# Patient Record
Sex: Male | Born: 1980 | Race: White | Hispanic: No | Marital: Married | Smoking: Never smoker
Health system: Southern US, Community
[De-identification: ages and names within clinical notes are randomized; demographics above are authoritative.]

## PROBLEM LIST (undated history)

## (undated) DIAGNOSIS — M549 Dorsalgia, unspecified: Secondary | ICD-10-CM

## (undated) HISTORY — PX: CHOLECYSTECTOMY: SHX55

---

## 2016-02-09 ENCOUNTER — Emergency Department (HOSPITAL_COMMUNITY)
Admission: EM | Admit: 2016-02-09 | Discharge: 2016-02-09 | Disposition: A | Discharge status: Home / Self Care | Payer: 59 | Attending: Emergency Medicine | Admitting: Emergency Medicine

## 2016-02-09 ENCOUNTER — Encounter (HOSPITAL_COMMUNITY): Payer: Self-pay | Admitting: Nurse Practitioner

## 2016-02-09 DIAGNOSIS — X500XXA Overexertion from strenuous movement or load, initial encounter: Secondary | ICD-10-CM | POA: Insufficient documentation

## 2016-02-09 DIAGNOSIS — Y929 Unspecified place or not applicable: Secondary | ICD-10-CM | POA: Diagnosis not present

## 2016-02-09 DIAGNOSIS — M545 Low back pain, unspecified: Secondary | ICD-10-CM

## 2016-02-09 DIAGNOSIS — S39012A Strain of muscle, fascia and tendon of lower back, initial encounter: Secondary | ICD-10-CM | POA: Diagnosis not present

## 2016-02-09 DIAGNOSIS — Y999 Unspecified external cause status: Secondary | ICD-10-CM | POA: Insufficient documentation

## 2016-02-09 DIAGNOSIS — Y9389 Activity, other specified: Secondary | ICD-10-CM | POA: Diagnosis not present

## 2016-02-09 DIAGNOSIS — T148XXA Other injury of unspecified body region, initial encounter: Secondary | ICD-10-CM

## 2016-02-09 HISTORY — DX: Dorsalgia, unspecified: M54.9

## 2016-02-09 MED ORDER — METHOCARBAMOL 500 MG PO TABS: 500.0000 mg | ORAL_TABLET | Freq: Two times a day (BID) | ORAL | 0 refills | Status: DC

## 2016-02-09 MED ORDER — DIAZEPAM 5 MG PO TABS
5.0000 mg | ORAL_TABLET | Freq: Once | ORAL | Status: AC
  Administered 2016-02-09: 5 mg via ORAL
  Filled 2016-02-09: qty 1

## 2016-02-09 NOTE — Discharge Instructions (Signed)
Please read and follow all provided instructions.  Your diagnoses today include:  1. Acute left-sided low back pain without sciatica   2. Muscle strain     Tests performed today include: Vital signs - see below for your results today  Medications prescribed:   Take any prescribed medications only as directed.  Home care instructions:  Follow any educational materials contained in this packet Please rest, use ice or heat on your back for the next several days Do not lift, push, pull anything more than 10 pounds for the next week  Follow-up instructions: Please follow-up with your primary care provider in the next 1 week for further evaluation of your symptoms.   Return instructions:  SEEK IMMEDIATE MEDICAL ATTENTION IF YOU HAVE: New numbness, tingling, weakness, or problem with the use of your arms or legs Severe back pain not relieved with medications Loss control of your bowels or bladder Increasing pain in any areas of the body (such as chest or abdominal pain) Shortness of breath, dizziness, or fainting.  Worsening nausea (feeling sick to your stomach), vomiting, fever, or sweats Any other emergent concerns regarding your health   Additional Information:  Your vital signs today were: BP 115/58 (BP Location: Left Arm)    Pulse 62    Temp 98.1 F (36.7 C) (Oral)    Resp 18    SpO2 99%  If your blood pressure (BP) was elevated above 135/85 this visit, please have this repeated by your doctor within one month. --------------

## 2016-02-09 NOTE — ED Triage Notes (Signed)
Pt c/o non traumatic lower back pain non radiating, denies bowel and bladder incontinence, onset was when he bend down to pick up a toy.

## 2016-02-09 NOTE — ED Provider Notes (Signed)
[facility]-EMERGENCY DEPT Provider Note   CSN: [id number] Arrival date & time: 02/09/16  2026  History   Chief Complaint Chief Complaint  Patient presents with  . Back Pain    HPI Jacob Mcmahon is a 35 y.o. male.  HPI  35 y.o. male presents to the Emergency Department today complaining of left lower back pain s/p lifting injury. Pt states that he bent over around 4 o clock and felt a pop in his low back. Noted pain on right side with pain on ROM. Notes relief with standing still. Describes pain as pulling sensation. Hx same with pulling back muscle. Notes pain 8/10 currently. No loss of bowel or bladder function. No fevers. No saddle anesthesia. No hx IV drug use. No N/V. No dysuria. No other symptoms noted.   Past Medical History:  Diagnosis Date  . Back pain     There are no active problems to display for this patient.   History reviewed. No pertinent surgical history.     Home Medications    Prior to Admission medications   Not on File    Family History History reviewed. No pertinent family history.  Social History Social History  Substance Use Topics  . Smoking status: Never Smoker  . Smokeless tobacco: Never Used  . Alcohol use No     Allergies   Patient has no known allergies.   Review of Systems Review of Systems  Constitutional: Negative for fever.  Gastrointestinal: Negative for nausea and vomiting.  Genitourinary: Negative for dysuria.  Musculoskeletal: Positive for back pain.  Neurological: Negative for numbness.   Physical Exam Updated Vital Signs BP 115/58 (BP Location: Left Arm)   Pulse 62   Temp 98.1 F (36.7 C) (Oral)   Resp 18   SpO2 99%   Physical Exam  Constitutional: He is oriented to person, place, and time. Vital signs are normal. He appears well-developed and well-nourished.  HENT:  Head: Normocephalic and atraumatic.  Right Ear: Hearing normal.  Left Ear: Hearing normal.  Eyes: Conjunctivae and EOM are normal. Pupils  are equal, round, and reactive to light.  Neck: Normal range of motion. Neck supple.  Cardiovascular: Normal rate, regular rhythm, normal heart sounds and intact distal pulses.   Pulmonary/Chest: Effort normal and breath sounds normal.  Musculoskeletal: Normal range of motion.  TTP left lower back. No midline spinous process tenderness. No palpable or visible deformities noted.   Neurological: He is alert and oriented to person, place, and time.  Skin: Skin is warm and dry.  Psychiatric: He has a normal mood and affect. His speech is normal and behavior is normal. Thought content normal.  Nursing note and vitals reviewed.  ED Treatments / Results  Labs (all labs ordered are listed, but only abnormal results are displayed) Labs Reviewed - No data to display  EKG  EKG Interpretation None       Radiology No results found.  Procedures Procedures (including critical care time)  Medications Ordered in ED Medications - No data to display   Initial Impression / Assessment and Plan / ED Course  I have reviewed the triage vital signs and the nursing notes.  Pertinent labs & imaging results that were available during my care of the patient were reviewed by me and considered in my medical decision making (see chart for details).  Clinical Course    Final Clinical Impressions(s) / ED Diagnoses     {I have reviewed the relevant previous healthcare records.  {I obtained HPI from  historian.   ED Course:  Assessment: Patient is a 35yM who presents to the ED with back pain after lifting injury. Hx same. Likely muscle strain. Isolated on left side. No midline tenderness. No neurological deficits appreciated. Patient is ambulatory. No warning symptoms of back pain including: fecal incontinence, urinary retention or overflow incontinence, night sweats, waking from sleep with back pain, unexplained fevers or weight loss, h/o cancer, IVDU, recent trauma. No concern for cauda equina, epidural  abscess, or other serious cause of back pain. Conservative measures such as rest, ice/heat and pain medicine indicated with PCP follow-up if no improvement with conservative management. Given Rx Robaxin.  Disposition/Plan:  DC Home Additional Verbal discharge instructions given and discussed with patient.  Pt Instructed to f/u with PCP in the next week for evaluation and treatment of symptoms. Return precautions given Pt acknowledges and agrees with plan  Supervising Physician Lavera Guiseana Duo Liu, MD  Final diagnoses:  Acute left-sided low back pain without sciatica  Muscle strain    New Prescriptions New Prescriptions   No medications on file     Audry Piliyler Kashawna Manzer, PA-C 02/09/16 2114    Lavera Guiseana Duo Liu, MD 02/10/16 [phone]

## 2016-02-10 ENCOUNTER — Encounter (HOSPITAL_COMMUNITY): Payer: Self-pay

## 2016-02-10 ENCOUNTER — Emergency Department (HOSPITAL_COMMUNITY)
Admission: EM | Admit: 2016-02-10 | Discharge: 2016-02-10 | Disposition: A | Discharge status: Home / Self Care | Payer: 59 | Attending: Emergency Medicine | Admitting: Emergency Medicine

## 2016-02-10 DIAGNOSIS — Y939 Activity, unspecified: Secondary | ICD-10-CM | POA: Insufficient documentation

## 2016-02-10 DIAGNOSIS — S39012A Strain of muscle, fascia and tendon of lower back, initial encounter: Secondary | ICD-10-CM

## 2016-02-10 DIAGNOSIS — M62830 Muscle spasm of back: Secondary | ICD-10-CM | POA: Diagnosis not present

## 2016-02-10 DIAGNOSIS — M5442 Lumbago with sciatica, left side: Secondary | ICD-10-CM | POA: Diagnosis not present

## 2016-02-10 DIAGNOSIS — X501XXA Overexertion from prolonged static or awkward postures, initial encounter: Secondary | ICD-10-CM | POA: Insufficient documentation

## 2016-02-10 DIAGNOSIS — Y929 Unspecified place or not applicable: Secondary | ICD-10-CM | POA: Diagnosis not present

## 2016-02-10 DIAGNOSIS — S3992XA Unspecified injury of lower back, initial encounter: Secondary | ICD-10-CM | POA: Diagnosis present

## 2016-02-10 DIAGNOSIS — Y999 Unspecified external cause status: Secondary | ICD-10-CM | POA: Insufficient documentation

## 2016-02-10 MED ORDER — HYDROCODONE-ACETAMINOPHEN 5-325 MG PO TABS: 1.0000 | ORAL_TABLET | Freq: Four times a day (QID) | ORAL | 0 refills | Status: DC | PRN

## 2016-02-10 MED ORDER — MELOXICAM 15 MG PO TABS: 15.0000 mg | ORAL_TABLET | Freq: Every day | ORAL | 0 refills | Status: DC

## 2016-02-10 MED ORDER — CYCLOBENZAPRINE HCL 10 MG PO TABS: 10.0000 mg | ORAL_TABLET | Freq: Three times a day (TID) | ORAL | 0 refills | Status: DC | PRN

## 2016-02-10 MED ORDER — KETOROLAC TROMETHAMINE 30 MG/ML IJ SOLN
30.0000 mg | Freq: Once | INTRAMUSCULAR | Status: AC
  Administered 2016-02-10: 30 mg via INTRAMUSCULAR
  Filled 2016-02-10: qty 1

## 2016-02-10 MED ORDER — PREDNISONE 20 MG PO TABS: ORAL_TABLET | ORAL | 0 refills | Status: DC

## 2016-02-10 NOTE — ED Triage Notes (Signed)
Pt c/o low back radiating down L leg after bending over to pick something up yesterday.  Pain score 5/10.  Pt reports injuring back several months ago and is followed by a chiropractor.  [facility] and reports not relief w/ prescribed medications.

## 2016-02-10 NOTE — ED Provider Notes (Signed)
[facility]-EMER[facility]ENCY DEPT Provider Note   CSN: [id number] Arrival date & time: 02/10/16  1511     History   Chief Complaint Chief Complaint  Patient presents with  . Back Pain    HPI Jacob Mcmahon is a 35 y.o. male with a PMHx of chronic back pain, who presents to the ED with complaints of acute on chronic lower back pain. Patient states that his had some issues with back pain several times in the past, always related to some sort of straining injury, but yesterday around 5 PM he bent over to pick up a totally and reinjured his back. He states this is the worst of the times that he flared up his back pain. He describes the pain as 6/10 constant spasming/sharp left lower back pain radiating into the left leg, worse with bending and movement, mildly improved with rest, and unrelieved with ibuprofen, ice, and heat. He was seen yesterday in the ER and given Valium and a prescription for Robaxin, which hasn't helped. Previously in December he was seen at an orthopedic urgent care for L hip and back pain, they x-rayed his back and told him there was nothing on the x-ray, and given prednisone and Zanaflex which didn't help much although he states the prednisone did help some with the paresthesias he has intermittently. Associated symptoms include intermittent left posterior thigh and foot tingling. He has an appointment January 8 for orthopedic evaluation and possibly an MRI.  He denies fevers, chills, CP, SOB, abd pain, N/V/D/C, hematuria, dysuria, incontinence of urine/stool, saddle anesthesia or cauda equina symptoms, myalgias, arthralgias, numbness, weakness, or any other complaints. Denies hx of IVDU or CA.    The history is provided by the patient and medical records. No language interpreter was used.  Back Pain   This is a recurrent problem. The current episode started yesterday. The problem occurs constantly. The problem has not changed since onset.Associated with: bending over to pick up  toy. The pain is present in the lumbar spine. Quality: spasmy sharp. The pain radiates to the left thigh. The pain is at a severity of 6/10. The pain is moderate. The symptoms are aggravated by bending and certain positions. The pain is the same all the time. Associated symptoms include tingling (L leg). Pertinent negatives include no chest pain, no fever, no numbness, no abdominal pain, no bowel incontinence, no perianal numbness, no bladder incontinence, no dysuria, no paresis and no weakness. He has tried heat, ice, NSAIDs, bed rest and muscle relaxants for the symptoms. The treatment provided mild (mild improvement with rest; otherwise no improvement with other medications) relief.    Past Medical History:  Diagnosis Date  . Back pain     There are no active problems to display for this patient.   History reviewed. No pertinent surgical history.     Home Medications    Prior to Admission medications   Medication Sig Start Date End Date Taking? Authorizing Provider  methocarbamol (ROBAXIN) 500 MG tablet Take 1 tablet (500 mg total) by mouth 2 (two) times daily. 02/09/16   Audry Piliyler Mohr, PA-C    Family History History reviewed. No pertinent family history.  Social History Social History  Substance Use Topics  . Smoking status: Never Smoker  . Smokeless tobacco: Never Used  . Alcohol use No     Allergies   Patient has no known allergies.   Review of Systems Review of Systems  Constitutional: Negative for chills and fever.  Respiratory: Negative for shortness of  breath.   Cardiovascular: Negative for chest pain.  Gastrointestinal: Negative for abdominal pain, bowel incontinence, constipation, diarrhea, nausea and vomiting.  Genitourinary: Negative for bladder incontinence, difficulty urinating (no incontinence), dysuria and hematuria.  Musculoskeletal: Positive for back pain. Negative for arthralgias and myalgias.  Skin: Negative for color change.  Allergic/Immunologic:  Negative for immunocompromised state.  Neurological: Positive for tingling (L leg). Negative for weakness and numbness.       +intermittent tingling L leg  Psychiatric/Behavioral: Negative for confusion.   10 Systems reviewed and are negative for acute change except as noted in the HPI.   Physical Exam Updated Vital Signs BP 138/79 (BP Location: Left Arm)   Pulse 70   Temp 98.3 F (36.8 C) (Oral)   Resp 18   Ht 6\' 5"  (1.956 m)   Wt 106.6 kg   SpO2 100%   BMI 27.87 kg/m   Physical Exam  Constitutional: He is oriented to person, place, and time. Vital signs are normal. He appears well-developed and well-nourished.  Non-toxic appearance. No distress.  Afebrile, nontoxic, NAD  HENT:  Head: Normocephalic and atraumatic.  Mouth/Throat: Mucous membranes are normal.  Eyes: Conjunctivae and EOM are normal. Right eye exhibits no discharge. Left eye exhibits no discharge.  Neck: Normal range of motion. Neck supple.  Cardiovascular: Normal rate and intact distal pulses.   Pulmonary/Chest: Effort normal. No respiratory distress.  Abdominal: Normal appearance. He exhibits no distension.  Musculoskeletal: Normal range of motion.       Lumbar back: He exhibits tenderness and spasm. He exhibits normal range of motion, no bony tenderness and no deformity.       Back:  Lumbar spine with FROM intact without spinous process TTP, no bony stepoffs or deformities, with moderate L sided paraspinous muscle TTP and palpable muscle spasms. Strength and sensation grossly intact in all extremities, +SLR on L side, neg on R side, gait steady and nonantalgic. No overlying skin changes. Distal pulses intact.   Neurological: He is alert and oriented to person, place, and time. He has normal strength. No sensory deficit.  Skin: Skin is warm, dry and intact. No rash noted.  Psychiatric: He has a normal mood and affect.  Nursing note and vitals reviewed.    ED Treatments / Results  Labs (all labs ordered are  listed, but only abnormal results are displayed) Labs Reviewed - No data to display  EKG  EKG Interpretation None       Radiology No results found.  Procedures Procedures (including critical care time)  Medications Ordered in ED Medications  ketorolac (TORADOL) 30 MG/ML injection 30 mg (not administered)     Initial Impression / Assessment and Plan / ED Course  I have reviewed the triage vital signs and the nursing notes.  Pertinent labs & imaging results that were available during my care of the patient were reviewed by me and considered in my medical decision making (see chart for details).  Clinical Course     35 y.o. male here with acute exacerbation of L lower back pain, states he's aggravated it several times but this is the worst. Has had issues with the L hip as well, saw ortho a few weeks ago and had xrays done which were neg. Started prednisone and zanaflex, pred helped some with the tingling in L leg. Has appt with ortho 02/25/16. Here yesterday, given robaxin, and that didn't help so he's back today. Tenderness to L paraspinous muscle with +spasm. No red flag s/s of low  back pain. No s/s of central cord compression or cauda equina. Lower extremities are neurovascularly intact and patient is ambulating without difficulty. No urinary symptoms. Doubt need for emergent imaging or work up today. Will give toradol here.  Patient was counseled on back pain precautions and told to do activity as tolerated but do not lift, push, or pull heavy objects more than 10 pounds for the next week. Patient counseled to use ice or heat on back for no longer than 15 minutes every hour.   [place] reviewed prior to dispensing controlled substance medications, and was notable for: no controlled substances. Risks/benefits/alternatives and expectations discussed regarding controlled substances. Side effects of medications discussed. Informed consent obtained.   Rx given for flexeril,  instructed to stop robaxin, and counseled on proper use of muscle relaxant medication. Rx given for narcotic pain medicine and counseled on proper use of narcotic pain medications. Told that they can increase to every 4 hrs if needed while pain is worse. Counseled not to combine this medication with others containing tylenol. Urged patient not to drink alcohol, drive, or perform any other activities that requires focus while taking either of these medications. Rx for mobic given, and prednisone.   Patient urged to follow-up with her orthopedist if pain does not improve with treatment and rest or if pain becomes recurrent. Urged to return with worsening severe pain, loss of bowel or bladder control, trouble walking. The patient verbalizes understanding and agrees with the plan.   Final Clinical Impressions(s) / ED Diagnoses   Final diagnoses:  Acute left-sided low back pain with left-sided sciatica  Muscle spasm of back  Strain of lumbar region, initial encounter    New Prescriptions New Prescriptions   CYCLOBENZAPRINE (FLEXERIL) 10 MG TABLET    Take 1 tablet (10 mg total) by mouth 3 (three) times daily as needed for muscle spasms.   HYDROCODONE-ACETAMINOPHEN (NORCO) 5-325 MG TABLET    Take 1 tablet by mouth every 6 (six) hours as needed for severe pain.   MELOXICAM (MOBIC) 15 MG TABLET    Take 1 tablet (15 mg total) by mouth daily. TAKE WITH MEALS; start on 02/11/16   PREDNISONE (DELTASONE) 20 MG TABLET    3 tabs po daily with breakfast x 4 days     [employer], PA-C 02/10/16 1631    Shaune Pollack, MD 02/11/16 1151

## 2016-02-10 NOTE — ED Notes (Signed)
Pt ambulated from waiting room to Minor Care room without difficulty.

## 2016-02-10 NOTE — Discharge Instructions (Signed)
Back Pain: Your back pain should be treated with medicines such as ibuprofen or aleve and this back pain should get better over the next 2 weeks.  However if you develop severe or worsening pain, low back pain with fever, numbness, weakness or inability to walk or urinate, you should return to the ER immediately.  Please follow up with your doctor this week for a recheck if still having symptoms.  Avoid heavy lifting over 10 pounds over the next two weeks. Avoid bending or twisting.  Low back pain is discomfort in the lower back that may be due to injuries to muscles and ligaments around the spine.  Occasionally, it may be caused by a a problem to a part of the spine called a disc.  The pain may last several days or a week;  However, most patients get completely well in 4 weeks.  Self - care:  The application of heat can help soothe the pain.  Maintaining your daily activities, including walking, is encourged, as it will help you get better faster than just staying in bed. Perform gentle stretching as discussed. Drink plenty of fluids.  Medications are also useful to help with pain control.  A commonly prescribed medication includes norco.  Do not drive or operate heavy machinery while taking this medication.  Non steroidal anti inflammatory medications including meloxicam (mobic), Ibuprofen, and naproxen;  These medications help both pain and swelling and are very useful in treating back pain.  They should be taken with food, as they can cause stomach upset, and more seriously, stomach bleeding.    Muscle relaxants:  These medications can help with muscle tightness that is a cause of lower back pain.  Most of these medications can cause drowsiness, and it is not safe to drive or use dangerous machinery while taking them.  Prednisone: this helps with sciatica symptoms, take with breakfast as directed.  SEEK IMMEDIATE MEDICAL ATTENTION IF: New numbness, tingling, weakness, or problem with the use of  your arms or legs.  Severe back pain not relieved with medications.  Difficulty with or loss of control of your bowel or bladder control.  Increasing pain in any areas of the body (such as chest or abdominal pain).  Shortness of breath, dizziness or fainting.  Nausea (feeling sick to your stomach), vomiting, fever, or sweats.  You will need to follow up with  Your orthopedist in 1-2 weeks for reassessment.

## 2016-11-15 LAB — GLUCOSE, POCT (MANUAL RESULT ENTRY): POC GLUCOSE: 113 mg/dL — AB (ref 70–99)

## 2018-02-06 ENCOUNTER — Emergency Department (HOSPITAL_COMMUNITY): Payer: 59

## 2018-02-06 ENCOUNTER — Other Ambulatory Visit: Payer: Self-pay

## 2018-02-06 ENCOUNTER — Emergency Department (HOSPITAL_COMMUNITY)
Admission: EM | Admit: 2018-02-06 | Discharge: 2018-02-06 | Disposition: A | Discharge status: Home / Self Care | Payer: 59 | Attending: Emergency Medicine | Admitting: Emergency Medicine

## 2018-02-06 ENCOUNTER — Encounter (HOSPITAL_COMMUNITY): Payer: Self-pay

## 2018-02-06 DIAGNOSIS — Z79899 Other long term (current) drug therapy: Secondary | ICD-10-CM | POA: Diagnosis not present

## 2018-02-06 DIAGNOSIS — K8020 Calculus of gallbladder without cholecystitis without obstruction: Secondary | ICD-10-CM | POA: Insufficient documentation

## 2018-02-06 DIAGNOSIS — R1011 Right upper quadrant pain: Secondary | ICD-10-CM | POA: Diagnosis present

## 2018-02-06 LAB — COMPREHENSIVE METABOLIC PANEL
ALK PHOS: 59 U/L (ref 38–126)
ALT: 37 U/L (ref 0–44)
ANION GAP: 12 (ref 5–15)
AST: 44 U/L — ABNORMAL HIGH (ref 15–41)
Albumin: 4.4 g/dL (ref 3.5–5.0)
BUN: 21 mg/dL — ABNORMAL HIGH (ref 6–20)
CALCIUM: 9.4 mg/dL (ref 8.9–10.3)
CO2: 22 mmol/L (ref 22–32)
Chloride: 104 mmol/L (ref 98–111)
Creatinine, Ser: 1.05 mg/dL (ref 0.61–1.24)
GFR calc Af Amer: >60 mL/min (ref >60)
GFR calc non Af Amer: >60 mL/min (ref >60)
GLUCOSE: 134 mg/dL — AB (ref 70–99)
Potassium: 3.3 mmol/L — ABNORMAL LOW (ref 3.5–5.1)
SODIUM: 138 mmol/L (ref 135–145)
Total Bilirubin: 0.6 mg/dL (ref 0.3–1.2)
Total Protein: 7.1 g/dL (ref 6.5–8.1)

## 2018-02-06 LAB — CBC
HCT: 43.8 % (ref 39.0–52.0)
HEMOGLOBIN: 14.6 g/dL (ref 13.0–17.0)
MCH: 28.6 pg (ref 26.0–34.0)
MCHC: 33.3 g/dL (ref 30.0–36.0)
MCV: 85.9 fL (ref 80.0–100.0)
Platelets: 275 10*3/uL (ref 150–400)
RBC: 5.1 MIL/uL (ref 4.22–5.81)
RDW: 11.9 % (ref 11.5–15.5)
WBC: 11.4 10*3/uL — ABNORMAL HIGH (ref 4.0–10.5)
nRBC: 0 % (ref 0.0–0.2)

## 2018-02-06 LAB — I-STAT TROPONIN, ED: Troponin i, poc: 0.01 ng/mL (ref 0.00–0.08)

## 2018-02-06 LAB — LIPASE, BLOOD: Lipase: 50 U/L (ref 11–51)

## 2018-02-06 MED ORDER — HYDROMORPHONE HCL 1 MG/ML IJ SOLN
1.0000 mg | Freq: Once | INTRAMUSCULAR | Status: AC
Start: 2018-02-06 — End: 2018-02-06
  Administered 2018-02-06: 1 mg via INTRAVENOUS
  Filled 2018-02-06: qty 1

## 2018-02-06 MED ORDER — HYDROCODONE-ACETAMINOPHEN 5-325 MG PO TABS: 2.0000 | ORAL_TABLET | Freq: Four times a day (QID) | ORAL | 0 refills | Status: DC | PRN

## 2018-02-06 MED ORDER — PANTOPRAZOLE SODIUM 40 MG IV SOLR
40.0000 mg | Freq: Once | INTRAVENOUS | Status: AC
  Administered 2018-02-06: 40 mg via INTRAVENOUS
  Filled 2018-02-06: qty 40

## 2018-02-06 MED ORDER — HYDROMORPHONE HCL 1 MG/ML IJ SOLN
1.0000 mg | Freq: Once | INTRAMUSCULAR | Status: AC
  Administered 2018-02-06: 1 mg via INTRAVENOUS
  Filled 2018-02-06: qty 1

## 2018-02-06 MED ORDER — LIDOCAINE VISCOUS HCL 2 % MT SOLN
15.0000 mL | Freq: Once | OROMUCOSAL | Status: AC
  Administered 2018-02-06: 15 mL via ORAL
  Filled 2018-02-06: qty 15

## 2018-02-06 MED ORDER — ALUM & MAG HYDROXIDE-SIMETH 200-200-20 MG/5ML PO SUSP
30.0000 mL | Freq: Once | ORAL | Status: AC
  Administered 2018-02-06: 30 mL via ORAL
  Filled 2018-02-06: qty 30

## 2018-02-06 MED ORDER — ONDANSETRON 4 MG PO TBDP: 4.0000 mg | ORAL_TABLET | Freq: Three times a day (TID) | ORAL | 0 refills | Status: DC | PRN

## 2018-02-06 MED ORDER — SUCRALFATE 1 G PO TABS: 1.0000 g | ORAL_TABLET | Freq: Three times a day (TID) | ORAL | 0 refills | Status: DC

## 2018-02-06 MED ORDER — SUCRALFATE 1 G PO TABS
1.0000 g | ORAL_TABLET | Freq: Once | ORAL | Status: AC
  Administered 2018-02-06: 1 g via ORAL
  Filled 2018-02-06: qty 1

## 2018-02-06 MED ORDER — MORPHINE SULFATE (PF) 4 MG/ML IV SOLN
4.0000 mg | Freq: Once | INTRAVENOUS | Status: AC
  Administered 2018-02-06: 4 mg via INTRAVENOUS
  Filled 2018-02-06: qty 1

## 2018-02-06 NOTE — ED Notes (Signed)
US at bedside

## 2018-02-06 NOTE — ED Provider Notes (Signed)
Patient [facility] Emergency Department [name] Patient reports lower chest/upper abdominal pain Exam : awake/alert, appears uncomfortable, focal epigastric tenderness, no other focal abd tenderness Plan: will obtain US imaging of upper abdomen Less likely ACS at this time     Jacob Mcmahon, Jacob Barbero, MD 02/06/18 [phone]

## 2018-02-06 NOTE — ED Provider Notes (Signed)
[facility]-EMERGENCY DEPT Provider Note   CSN: [id number] Arrival date & time: 02/06/18  0153     History   Chief Complaint Chief Complaint  Patient presents with  . Chest Pain  . Abdominal Pain    HPI Jacob Mcmahon is a 37 y.o. male.  Patient presents to the emergency department with a chief complaint of epigastric pain.  He states the pain started about 10:30 PM.  He tried taking some Prilosec with no relief.  He states pain is severe.  He is unable to find anything to give him any relief.  Denies any radiating pain to his back.  Denies any pain in his lower abdomen.  States that he has had symptoms similar to this in the past, but they have never lasted this long.  Denies any fever, chills, or productive cough.  Denies any history of heart disease.  States he last drank alcohol on Wednesday night, but does not drink heavily.  The history is provided by the patient. No language interpreter was used.    Past Medical History:  Diagnosis Date  . Back pain     There are no active problems to display for this patient.   History reviewed. No pertinent surgical history.      Home Medications    Prior to Admission medications   Medication Sig Start Date End Date Taking? Authorizing Provider  cyclobenzaprine (FLEXERIL) 10 MG tablet Take 1 tablet (10 mg total) by mouth 3 (three) times daily as needed for muscle spasms. 02/10/16   Street, [place], PA-C  HYDROcodone-acetaminophen (NORCO) 5-325 MG tablet Take 1 tablet by mouth every 6 (six) hours as needed for severe pain. 02/10/16   Street, [place], PA-C  meloxicam (MOBIC) 15 MG tablet Take 1 tablet (15 mg total) by mouth daily. TAKE WITH MEALS; start on 02/11/16 02/10/16   Street, [place], PA-C  methocarbamol (ROBAXIN) 500 MG tablet Take 1 tablet (500 mg total) by mouth 2 (two) times daily. 02/09/16   Audry PiliMohr, [place], PA-C  predniSONE (DELTASONE) 20 MG tablet 3 tabs po daily with breakfast x 4 days  02/10/16   Street, [place], [place]C    Family History History reviewed. No pertinent family history.  Social History Social History   Tobacco Use  . Smoking status: Never Smoker  . Smokeless tobacco: Never Used  Substance Use Topics  . Alcohol use: No  . Drug use: Not on file     Allergies   Patient has no known allergies.   Review of Systems Review of Systems  All other systems reviewed and are negative.    Physical Exam Updated Vital Signs BP 137/82 (BP Location: Left Arm)   Pulse 62   Temp (!) 97.4 F (36.3 C) (Oral)   Resp (!) 30   Ht 6\' 5"  (1.956 m)   Wt 106.6 kg   SpO2 100%   BMI 27.87 kg/m   Physical Exam Vitals signs and nursing note reviewed.  Constitutional:      Appearance: He is well-developed.     Comments: Visibly uncomfortable  HENT:     Head: Normocephalic and atraumatic.  Eyes:     General: No scleral icterus.       Right eye: No discharge.        Left eye: No discharge.     Conjunctiva/sclera: Conjunctivae normal.     Pupils: Pupils are equal, round, and reactive to light.  Neck:     Musculoskeletal: Normal range of motion and neck supple.  Vascular: No JVD.  Cardiovascular:     Rate and Rhythm: Normal rate and regular rhythm.     Heart sounds: Normal heart sounds. No murmur. No friction rub. No gallop.   Pulmonary:     Effort: Pulmonary effort is normal. No respiratory distress.     Breath sounds: Normal breath sounds. No wheezing or rales.  Chest:     Chest wall: No tenderness.  Abdominal:     General: There is no distension.     Palpations: Abdomen is soft. There is no mass.     Tenderness: There is no abdominal tenderness. There is no guarding or rebound.     Comments: No focal tenderness  Musculoskeletal: Normal range of motion.        General: No tenderness.  Skin:    General: Skin is warm and dry.  Neurological:     Mental Status: He is alert and oriented to person, place, and time.  Psychiatric:        Behavior:  Behavior normal.        Thought Content: Thought content normal.        Judgment: Judgment normal.      ED Treatments / Results  Labs (all labs ordered are listed, but only abnormal results are displayed) Labs Reviewed  CBC  LIPASE, BLOOD  COMPREHENSIVE METABOLIC PANEL  I-STAT TROPONIN, ED    EKG EKG Interpretation  Date/Time:  Saturday February 06 2018 02:00:36 EST Ventricular Rate:  60 PR Interval:    QRS Duration: 113 QT Interval:  441 QTC Calculation: 441 R Axis:   49 Text Interpretation:  Sinus rhythm Borderline intraventricular conduction delay Minimal ST depression ST elev, probable normal early repol pattern No previous ECGs available Confirmed by Zadie RhineWickline, Donald ([id number]) on 02/06/2018 2:11:21 AM   Radiology Dg Chest 2 View  Result Date: 02/06/2018 CLINICAL DATA:  Acute onset of central chest pain and epigastric abdominal pain. EXAM: CHEST - 2 VIEW COMPARISON:  None. FINDINGS: The lungs are well-aerated and clear. There is no evidence of focal opacification, pleural effusion or pneumothorax. The heart is normal in size; the mediastinal contour is within normal limits. No acute osseous abnormalities are seen. IMPRESSION: No acute cardiopulmonary process seen. Electronically Signed   By: Roanna RaiderJeffery  Chang M.D.   On: 02/06/2018 02:31   [place] Abdomen Limited  Result Date: 02/06/2018 CLINICAL DATA:  Epigastric and right upper quadrant pain EXAM: ULTRASOUND ABDOMEN LIMITED RIGHT UPPER QUADRANT COMPARISON:  None. FINDINGS: Gallbladder: Nonmobile gallstones in the fundus. Largest gallstone 17 mm. Negative for gallbladder wall thickening. Negative sonographic Murphy sign Common bile duct: Diameter: 4 mm Liver: Mildly increased echogenicity liver diffusely without focal lesion. Portal vein is patent on color Doppler imaging with normal direction of blood flow towards the liver. IMPRESSION: Cholelithiasis without cholecystitis Hepatic steatosis Electronically Signed   By: Marlan Palauharles  Clark  M.D.   On: 02/06/2018 07:24    Procedures Procedures (including critical care time)  Medications Ordered in ED Medications  alum & mag hydroxide-simeth (MAALOX/MYLANTA) 200-200-20 MG/5ML suspension 30 mL (30 mLs Oral Given 02/06/18 0230)    And  lidocaine (XYLOCAINE) 2 % viscous mouth solution 15 mL (15 mLs Oral Given 02/06/18 0230)  morphine 4 MG/ML injection 4 mg (4 mg Intravenous Given 02/06/18 0234)     Initial Impression / Assessment and Plan / ED Course  I have reviewed the triage vital signs and the nursing notes.  Pertinent labs & imaging results that were available during my care of the  patient were reviewed by me and considered in my medical decision making (see chart for details).     Patient with severe epigastric pain.  Symptoms started tonight before bed.  Tried taking Prilosec with no relief.  Vital signs are stable.  Patient appears very uncomfortable.  Will give pain medicine.  Will give GI cocktail and Protonix.  Will reassess.  Laboratory work-up is reassuring.  Lipase normal.  Troponin negative.  EKG shows no ischemic changes.  Doubt ACS.  Pain does not radiate to his back, doubt pancreatitis.  He does not have any lower abdominal pain.  Patient seen by and discussed with Dr. Bebe Shaggy, who recommends adding RUQ [place].  Patient signed out to [employer], [place], who will follow up on ultrasound. Final Clinical Impressions(s) / ED Diagnoses   Final diagnoses:  Calculus of gallbladder without cholecystitis without obstruction    ED Discharge Orders         Ordered    sucralfate (CARAFATE) 1 g tablet  3 times daily with meals & bedtime     02/06/18 0618    HYDROcodone-acetaminophen (NORCO/VICODIN) 5-325 MG tablet  Every 6 hours PRN     02/06/18 0629    ondansetron (ZOFRAN ODT) 4 MG disintegrating tablet  Every 8 hours PRN     02/06/18 0629           Roxy Horseman, PA-C 02/07/18 0241    Zadie Rhine, MD 02/07/18 [phone]

## 2018-02-06 NOTE — Discharge Instructions (Addendum)
Please see the information and instructions below regarding your visit.  Your diagnoses today include:  1. Calculus of gallbladder without cholecystitis without obstruction      Tests performed today include: See side panel of your discharge paperwork for testing performed today. Vital signs are listed at the bottom of these instructions.   The ultrasound of your gallbladder shows stones but no other acute abnormalities.  You do have some evidence of possible fatty liver.  Medications prescribed:    Take any prescribed medications only as prescribed, and any over the counter medications only as directed on the packaging.  You have been prescribed Norco for pain. This is an opioid pain medication. You may take this medication every 4-6 hours as needed for pain. Only take this medication if you need it for breakthrough pain.   Do not combine this medication with Tylenol, as it may increase the risk of liver problems.  Do not combine this medication with alcohol.  Please be advised to avoid driving or operating heavy machinery while taking this medication, as it may make you drowsy or impair judgment.   While you are being treated for possible gastritis, I recommend avoiding any nonsteroidal medicines such as ibuprofen, naproxen, or aspirin.  Home care instructions:  Please follow any educational materials contained in this packet.   Please resume eating with clear liquids at first.  Do not have any spicy foods or dairy foods while you are recovering.  Follow-up instructions: Please follow-up with your primary care provider in 5 days for further evaluation of your symptoms if they are not completely improved.   Please follow up with [facility] regarding consultation about the gallstones.  This is not an immediate emergent surgery, but if you continue to have problems, you may need consultation for removing her gallbladder.  Return instructions:  Please return to the  Emergency Department if you experience worsening symptoms.  Please return the emergency department if you develop any worsening pain, nausea or vomiting the prevention keep anything down, chest pain or shortness of breath, feel you are going to pass out, or any new or worsening symptoms. Please return if you have any other emergent concerns.  Additional Information:   Your vital signs today were: BP 133/63 (BP Location: Right Arm)    Pulse 66    Temp (!) 97.4 F (36.3 C) (Oral)    Resp 15    Ht 6\' 5"  (1.956 m)    Wt 106.6 kg    SpO2 100%    BMI 27.87 kg/m  If your blood pressure (BP) was elevated on multiple readings during this visit above 130 for the top number or above 80 for the bottom number, please have this repeated by your primary care provider within one month. --------------  Thank you for allowing us to participate in your care today.

## 2018-02-06 NOTE — ED Triage Notes (Signed)
Pt reports epigastric abdominal and centralized chest pain that started around 1030p. He states that he took some prilosec without relief. Pt is unable to stay still at time of triage.

## 2018-02-06 NOTE — ED Provider Notes (Signed)
Physical Exam  BP 129/64 (BP Location: Right Arm)   Pulse 63   Temp (!) 97.4 F (36.3 C) (Oral)   Resp (!) 22   Ht 6\' 5"  (1.956 m)   Wt 106.6 kg   SpO2 100%   BMI 27.87 kg/m   Assumed care from Ivar Drapeob Browning, PA-C at 6:36 AM.  See his full note for H&P.  Briefly, the patient is a 37 y.o. male with PMHx of  has a past medical history of Back pain. here with epigastric pain.  Began last night.    Patient is thus far had negative work-up with overall reassuring labs.  Slight leukocytosis of 11.4.  Lipase nonelevated.  Troponin normal.  EKG without evidence of acute ischemia, pressure, or arrhythmia.  Patient received multiple rounds of analgesia.  At this time, patient has had right upper quadrant ultrasound with evidence of cholelithiasis, however the views on the ultrasound did not show patency of the portal vein and additional ultrasonography is needed to visualize the portal vein.  Patient will be sent back to ultrasound after 7 AM shift change.  Labs Reviewed  CBC - Abnormal; Notable for the following components:      Result Value   WBC 11.4 (*)    All other components within normal limits  COMPREHENSIVE METABOLIC PANEL - Abnormal; Notable for the following components:   Potassium 3.3 (*)    Glucose, Bld 134 (*)    BUN 21 (*)    AST 44 (*)    All other components within normal limits  LIPASE, BLOOD  I-STAT TROPONIN, ED    Course of Care:   Physical Exam Vitals signs and nursing note reviewed.  Constitutional:      General: He is not in acute distress.    Appearance: He is well-developed. He is not diaphoretic.     Comments: Sitting comfortably in bed.  HENT:     Head: Normocephalic and atraumatic.  Eyes:     General:        Right eye: No discharge.        Left eye: No discharge.     Conjunctiva/sclera: Conjunctivae normal.     Comments: EOMs normal to gross examination.  Neck:     Musculoskeletal: Normal range of motion.  Cardiovascular:     Rate and Rhythm:  Normal rate and regular rhythm.  Abdominal:     General: There is no distension.     Comments: Nonsurgical abdomen.  Mild epigastric tenderness.  Musculoskeletal: Normal range of motion.  Skin:    General: Skin is warm and dry.  Neurological:     Mental Status: He is alert.     Comments: Cranial nerves intact to gross observation. Patient moves extremities without difficulty.  Psychiatric:        Behavior: Behavior normal.        Thought Content: Thought content normal.        Judgment: Judgment normal.     ED Course/Procedures     Procedures  MDM  Patient reassessed.  Resting comfortably with nonsurgical abdomen.  Discussed the results of patient's right upper quadrant ultrasound.  Discussed follow-up with PCP and subsequently to surgery.  Return precautions given for any intractable nausea vomiting, or new or worsening symptoms.  Patient prescribed Norco by previous provider.  Strict patient to drive, drink alcohol operate machinery while taking this medication.  All questions asked by patient and family.  Patient discharged in good condition.  Elisha PonderMurray, Whitney Hillegass B, PA-C 02/06/18 [id number]    Zadie RhineWickline, Donald, MD 02/06/18 [phone]

## 2018-02-26 ENCOUNTER — Ambulatory Visit: Payer: Self-pay | Admitting: Surgery

## 2018-02-26 NOTE — H&P (Signed)
History of Present Illness Jacob Mcmahon(Jacob Ken K. Mercie Balsley MD; 02/26/2018 2:01 PM) The patient is a 38 year old male who presents for evaluation of gall stones. Referred by Dr. Bebe ShaggyWickline for symptomatic gallstones.   This is a healthy 38 year old male who presents with two months of intermittent epigastric and RUQ abdominal pain associated with nausea, vomiting, bloating. He had one visit to the ED where he was ruled out for cardiac issues. US showed gallstones but no wall-thickening. It took a couple of days for the pain to resolve and the patient has been following a strict lowfat diet. LFT's were WNL. He presents now to discuss cholecystectomy.  CLINICAL DATA: Epigastric and right upper quadrant pain  EXAM: ULTRASOUND ABDOMEN LIMITED RIGHT UPPER QUADRANT  COMPARISON: None.  FINDINGS: Gallbladder:  Nonmobile gallstones in the fundus. Largest gallstone 17 mm. Negative for gallbladder wall thickening. Negative sonographic Murphy sign  Common bile duct:  Diameter: 4 mm  Liver:  Mildly increased echogenicity liver diffusely without focal lesion. Portal vein is patent on color Doppler imaging with normal direction of blood flow towards the liver.  IMPRESSION: Cholelithiasis without cholecystitis  Hepatic steatosis   Electronically Signed By: Marlan Palauharles Clark M.D. On: 02/06/2018 07:24   Past Surgical History Jacob Mcmahon(Jacob [place], RMA; 02/26/2018 11:22 AM) Spinal Surgery - Lower Back  Diagnostic Studies History Jacob Mcmahon(Jacob [place], RMA; 02/26/2018 11:22 AM) Colonoscopy 1-5 years ago  Allergies Jacob Mcmahon(Jacob Mcmahon, RMA; 02/26/2018 11:23 AM) No Known Drug Allergies [02/26/2018]: Allergies Reconciled  Medication History [facility](Jacob Mcmahon, RMA; 02/26/2018 11:24 AM) Ondansetron (4MG  Tablet Disint, Oral) Active. Multi Vitamin (Oral) Active. HYDROcodone-Acetaminophen (Oral) Specific strength unknown - Active. Carafate (1GM Tablet, Oral) Active. Medications  Reconciled  Social History Jacob Mcmahon(Jacob [place], RMA; 02/26/2018 11:22 AM) Alcohol use Occasional alcohol use. Caffeine use Carbonated beverages, Coffee. No drug use Tobacco use Never smoker.  Family History Jacob Mcmahon(Jacob [place]aggett, RMA; 02/26/2018 11:22 AM) Colon Polyps Family Members In General. Depression Mother. Diabetes Mellitus Father. Hypertension Family Members In General. Kidney Disease Father.  Other Problems Jacob Mcmahon(Jacob [place], RMA; 02/26/2018 11:22 AM) Back Pain Cholelithiasis Gastroesophageal Reflux Disease Migraine Headache     Review of Systems (Jacob Mcmahon RMA; 02/26/2018 11:22 AM) General Not Present- Appetite Loss, Chills, Fatigue, Fever, Night Sweats, Weight Gain and Weight Loss. Skin Not Present- Change in Wart/Mole, Dryness, Hives, Jaundice, New Lesions, Non-Healing Wounds, Rash and Ulcer. HEENT Not Present- Earache, Hearing Loss, Hoarseness, Nose Bleed, Oral Ulcers, Ringing in the Ears, Seasonal Allergies, Sinus Pain, Sore Throat, Visual Disturbances, Wears glasses/contact lenses and Yellow Eyes. Respiratory Not Present- Bloody sputum, Chronic Cough, Difficulty Breathing, Snoring and Wheezing. Breast Not Present- Breast Mass, Breast Pain, Nipple Discharge and Skin Changes. Cardiovascular Present- Chest Pain. Not Present- Difficulty Breathing Lying Down, Leg Cramps, Palpitations, Rapid Heart Rate, Shortness of Breath and Swelling of Extremities. Gastrointestinal Present- Abdominal Pain. Not Present- Bloating, Bloody Stool, Change in Bowel Habits, Chronic diarrhea, Constipation, Difficulty Swallowing, Excessive gas, Gets full quickly at meals, Hemorrhoids, Indigestion, Nausea, Rectal Pain and Vomiting. Male Genitourinary Not Present- Blood in Urine, Change in Urinary Stream, Frequency, Impotence, Nocturia, Painful Urination, Urgency and Urine Leakage. Musculoskeletal Not Present- Back Pain, Joint Pain, Joint Stiffness, Muscle Pain, Muscle Weakness and  Swelling of Extremities. Neurological Not Present- Decreased Memory, Fainting, Headaches, Numbness, Seizures, Tingling, Tremor, Trouble walking and Weakness. Psychiatric Not Present- Anxiety, Bipolar, Change in Sleep Pattern, Depression, Fearful and Frequent crying. Endocrine Not Present- Cold Intolerance, Excessive Hunger, Hair Changes, Heat Intolerance, Hot flashes and New Diabetes. Hematology Not Present- Blood Thinners, Easy Bruising,  Excessive bleeding, Gland problems, HIV and Persistent Infections.  Vitals Jacob Mcmahon(Jacob Mcmahon RMA; 02/26/2018 11:24 AM) 02/26/2018 11:24 AM Weight: 232.6 lb Height: 77in Body Surface Area: 2.38 m Body Mass Index: 27.58 kg/m Temp.: 97.F(Temporal)  Pulse: 68 (Regular)  P.OX: 98% (Room air) BP: 140/72 (Sitting, Left Arm, Standard)      Physical Exam Jacob Mcmahon(Jacob Manuele K. Coralee Edberg MD; 02/26/2018 2:01 PM)  The physical exam findings are as follows: Note:WDWN in NAD Eyes: Pupils equal, round; sclera anicteric HENT: Oral mucosa moist; good dentition Neck: No masses palpated, no thyromegaly Lungs: CTA bilaterally; normal respiratory effort CV: Regular rate and rhythm; no murmurs; extremities well-perfused with no edema Abd: +bowel sounds, soft, non-tender, no palpable organomegaly; no palpable hernias Skin: Warm, dry; no sign of jaundice Psychiatric - alert and oriented x 4; calm mood and affect    Assessment & Plan Jacob Mcmahon(Karron Goens K. Sherlin Sonier MD; 02/26/2018 11:49 AM)  CHRONIC CHOLECYSTITIS WITH CALCULUS (K80.10)  Current Plans Schedule for Surgery - Laparoscopic cholecystectomy with intraoperative cholangiogram. The surgical procedure has been discussed with the patient. Potential risks, benefits, alternative treatments, and expected outcomes have been explained. All of the patient's questions at this time have been answered. The likelihood of reaching the patient's treatment goal is good. The patient understand the proposed surgical procedure and wishes to  proceed.  Jacob ArmsMatthew K. Corliss Skainssuei, MD, [facility]  General/ Trauma Surgery Bee[place] [phone]  02/26/2018 2:01 PM

## 2018-12-16 ENCOUNTER — Other Ambulatory Visit: Payer: Self-pay

## 2018-12-16 DIAGNOSIS — Z20822 Contact with and (suspected) exposure to covid-19: Secondary | ICD-10-CM

## 2018-12-17 LAB — NOVEL CORONAVIRUS, NAA: SARS-CoV-2, NAA: NOT DETECTED

## 2019-02-10 ENCOUNTER — Ambulatory Visit: Payer: 59 | Attending: Internal Medicine

## 2019-02-10 DIAGNOSIS — Z20822 Contact with and (suspected) exposure to covid-19: Secondary | ICD-10-CM

## 2019-02-11 LAB — NOVEL CORONAVIRUS, NAA: SARS-CoV-2, NAA: NOT DETECTED

## 2019-03-31 ENCOUNTER — Ambulatory Visit: Payer: 59 | Attending: Internal Medicine

## 2019-03-31 DIAGNOSIS — Z20822 Contact with and (suspected) exposure to covid-19: Secondary | ICD-10-CM

## 2019-04-01 LAB — NOVEL CORONAVIRUS, NAA: SARS-CoV-2, NAA: NOT DETECTED

## 2019-12-04 IMAGING — US US ABDOMEN LIMITED
2 series · 14 of 25 positions shown · non-contrast
Comparison: None.

CLINICAL DATA: Epigastric and right upper quadrant pain

EXAM:
ULTRASOUND ABDOMEN LIMITED RIGHT UPPER QUADRANT

[Series 1: us abdomen limited · 0.28mm/px · 11 of 28 slices shown (1 of 2)]
[im 1/28]
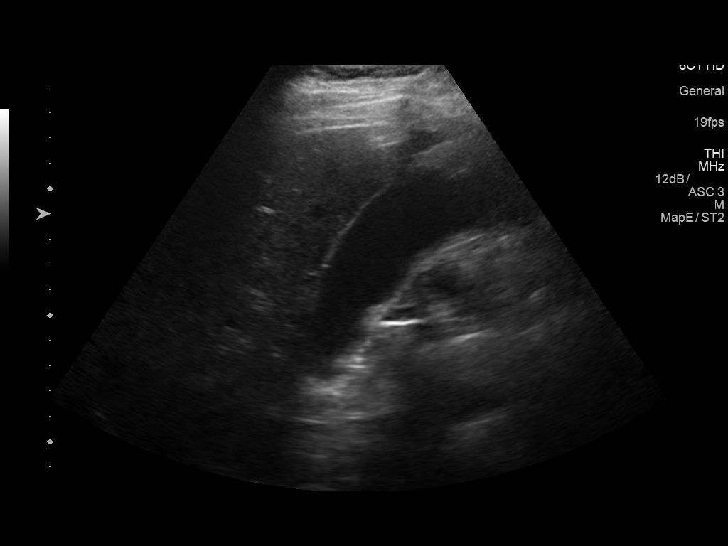
[im 3/28]
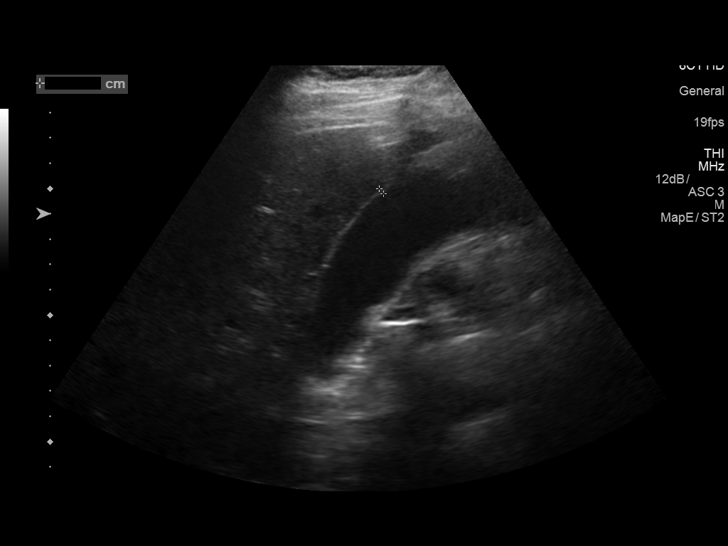
[im 6/28]
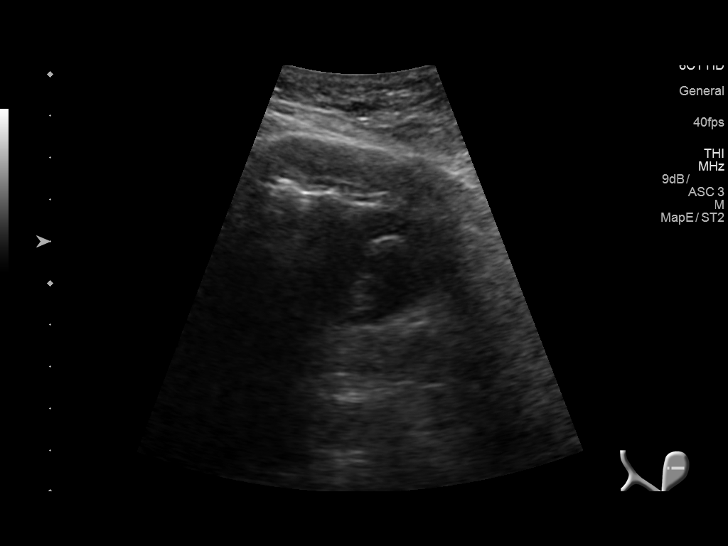
[im 9/28]
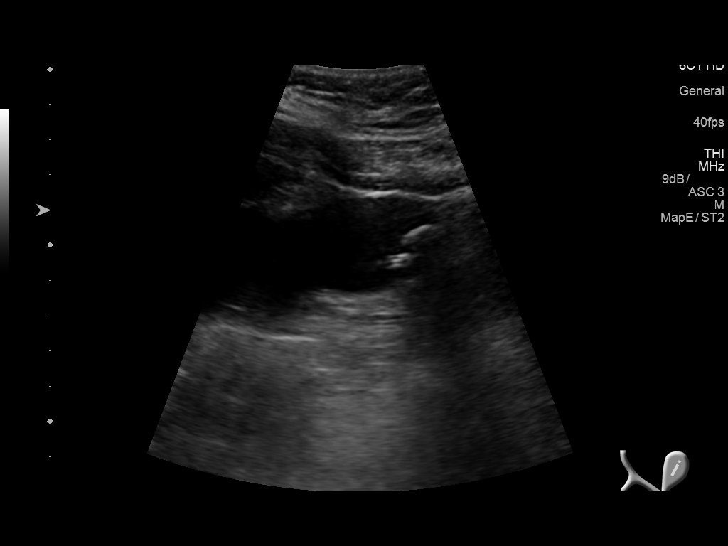
[im 12/28]
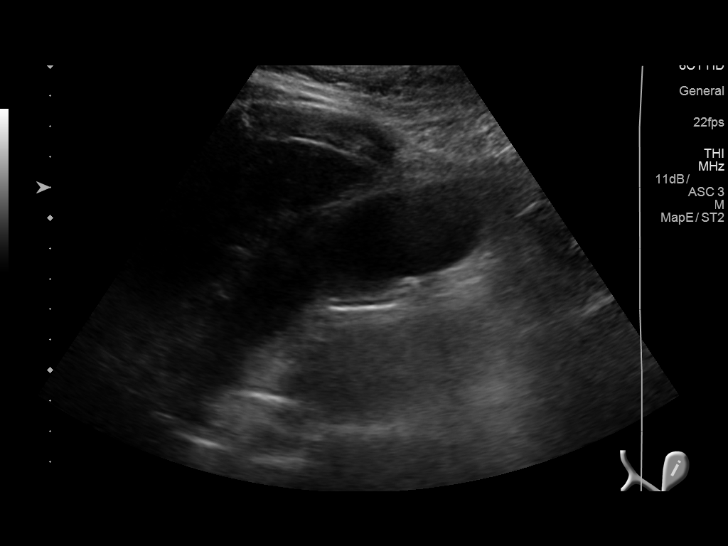
[im 13/28]
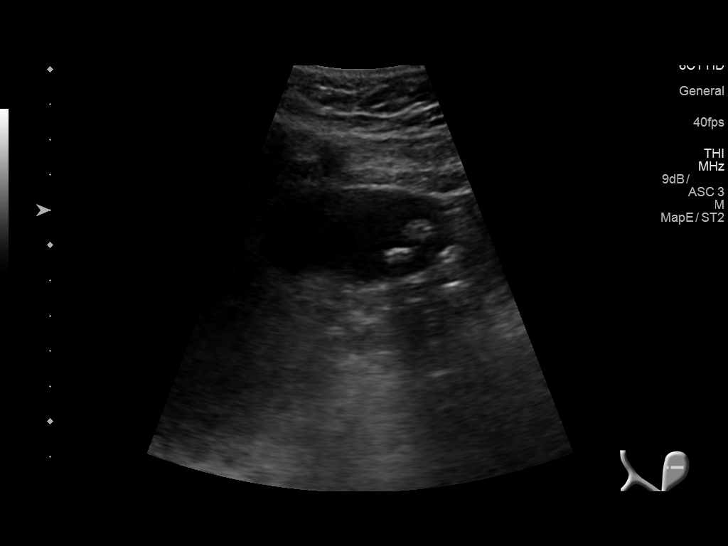
[im 16/28]
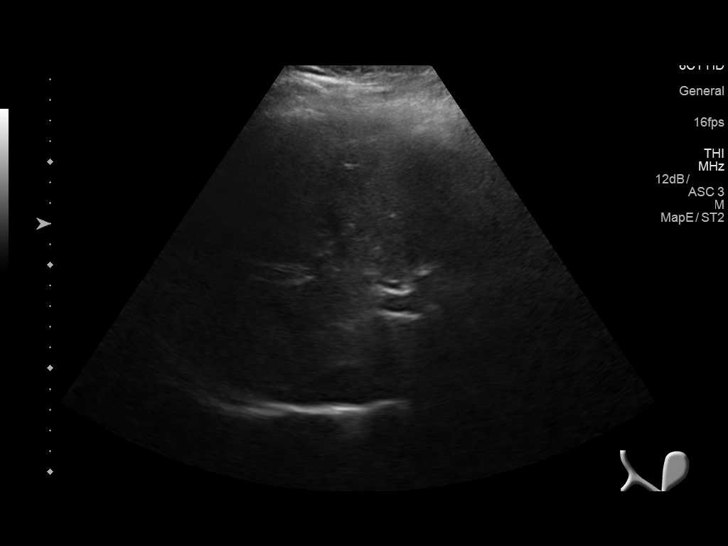
[im 19/28]
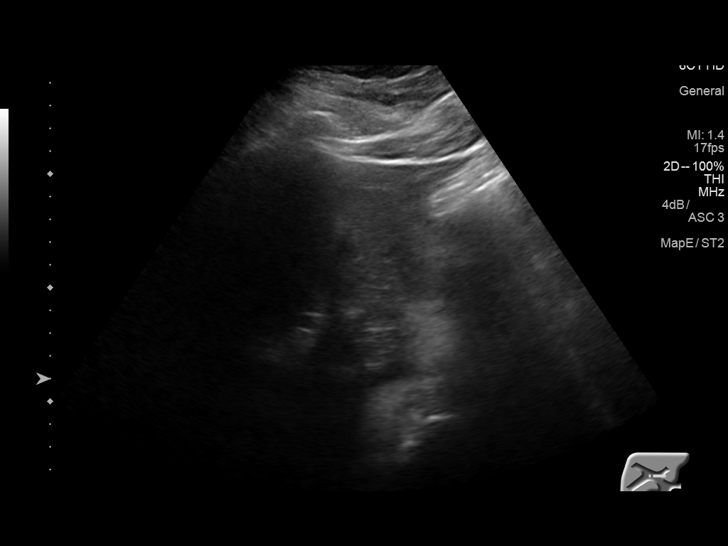
[im 22/28]
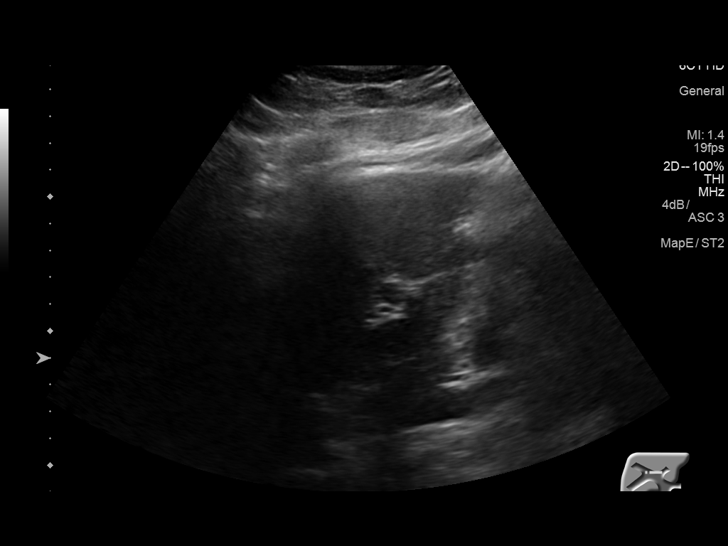
[im 23/28]
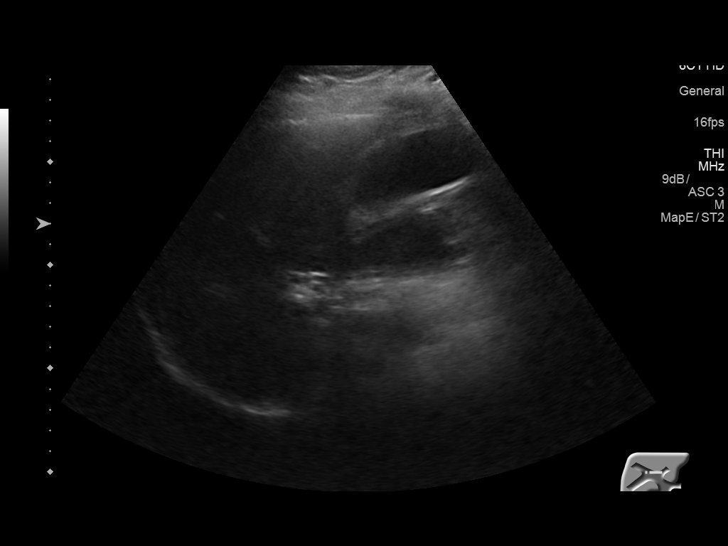
[im 26/28]
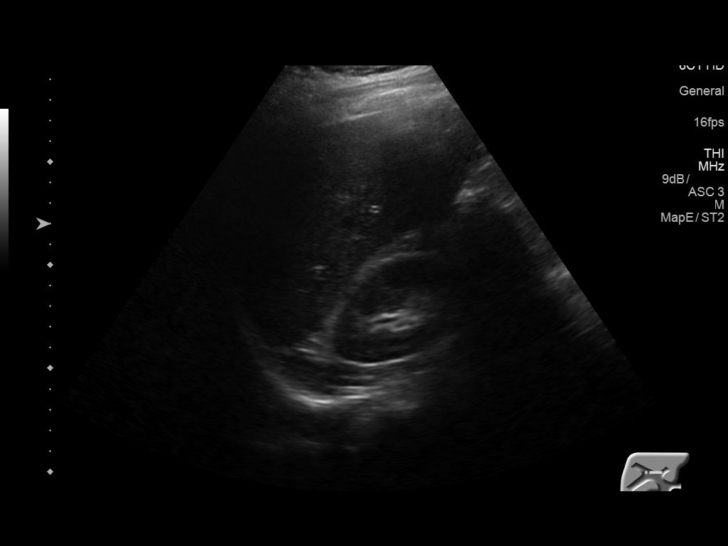

[Series 2: us abdomen limited · 3 of 7 slices shown (2 of 2)]
[im 1/7]
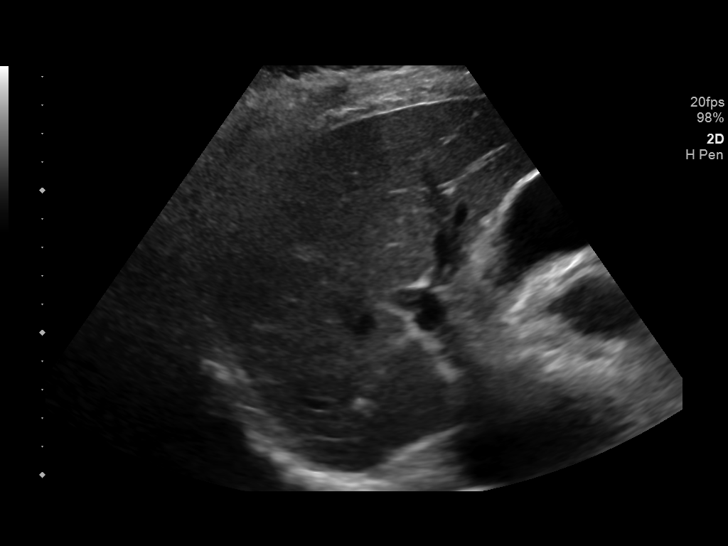
[im 4/7]
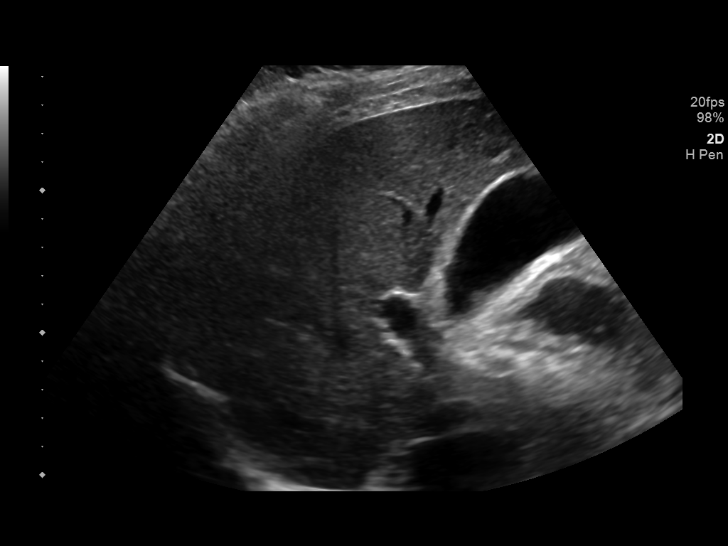
[im 7/7]
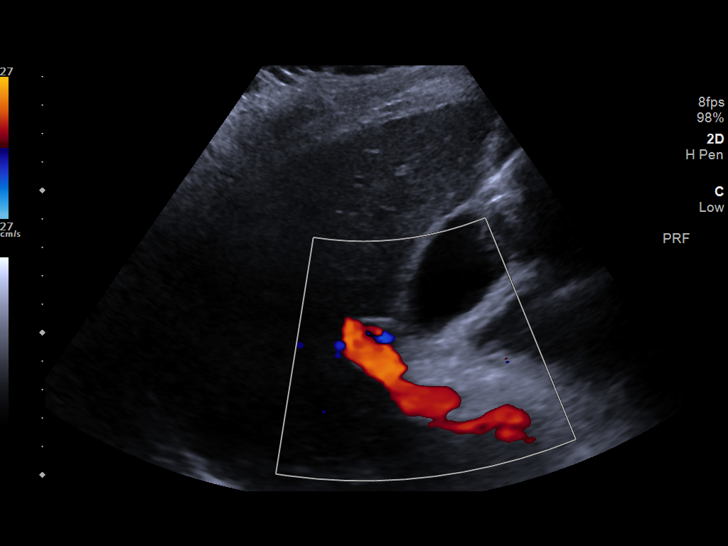

[14 of 25 positions shown; findings below may reference images not displayed]

FINDINGS: Gallbladder:

Nonmobile gallstones in the fundus. Largest gallstone 17 mm.
Negative for gallbladder wall thickening. Negative sonographic
Murphy sign

Common bile duct:

Diameter: 4 mm

Liver:

Mildly increased echogenicity liver diffusely without focal lesion.
Portal vein is patent on color Doppler imaging with normal direction
of blood flow towards the liver.
IMPRESSION: Cholelithiasis without cholecystitis

Hepatic steatosis

## 2020-06-27 ENCOUNTER — Telehealth: Payer: 59 | Admitting: Family

## 2020-06-27 DIAGNOSIS — U071 COVID-19: Secondary | ICD-10-CM

## 2020-06-27 MED ORDER — DEXAMETHASONE 6 MG PO TABS: 6.0000 mg | ORAL_TABLET | Freq: Two times a day (BID) | ORAL | 0 refills | Status: DC

## 2020-06-27 MED ORDER — NIRMATRELVIR/RITONAVIR (PAXLOVID)TABLET: 3.0000 | ORAL_TABLET | Freq: Two times a day (BID) | ORAL | 0 refills | Status: AC

## 2020-06-27 MED ORDER — BENZONATATE 100 MG PO CAPS: 100.0000 mg | ORAL_CAPSULE | Freq: Three times a day (TID) | ORAL | 0 refills | Status: DC | PRN

## 2020-06-27 MED ORDER — ALBUTEROL SULFATE HFA 108 (90 BASE) MCG/ACT IN AERS: 2.0000 | INHALATION_SPRAY | Freq: Four times a day (QID) | RESPIRATORY_TRACT | 0 refills | Status: DC | PRN

## 2020-06-27 NOTE — Progress Notes (Signed)
E-Visit for Positive Covid Test Result We are sorry you are not feeling well. We are here to help!  You have tested positive for COVID-19, meaning that you were infected with the novel coronavirus and could give the virus to others.  It is vitally important that you stay home so you do not spread it to others.      Please continue isolation at home, for at least 10 days since the start of your symptoms and until you have had 24 hours with no fever (without taking a fever reducer) and with improving of symptoms.  If you have no symptoms but tested positive (or all symptoms resolve after 5 days and you have no fever) you can leave your house but continue to wear a mask around others for an additional 5 days. If you have a fever,continue to stay home until you have had 24 hours of no fever. Most cases improve 5-10 days from onset but we have seen a small number of patients who have gotten worse after the 10 days.  Please be sure to watch for worsening symptoms and remain taking the proper precautions.   Go to the nearest hospital ED for assessment if fever/cough/breathlessness are severe or illness seems like a threat to life.    The following symptoms may appear 2-14 days after exposure: . Fever . Cough . Shortness of breath or difficulty breathing . Chills . Repeated shaking with chills . Muscle pain . Headache . Sore throat . New loss of taste or smell . Fatigue . Congestion or runny nose . Nausea or vomiting . Diarrhea  You have been enrolled in [facility] Monitoring for COVID-19. Daily you will receive a questionnaire within the MyChart website. Our COVID-19 response team will be monitoring your responses daily.  You can use medication such as prescription cough medication called Tessalon Perles 100 mg. You may take 1-2 capsules every 8 hours as needed for cough and  prescription inhaler called Albuterol MDI 90 mcg /actuation 2 puffs every 4 hours as needed for shortness of breath,  wheezing, cough dexamethasone 6 mg twice a day and paxlovid an antiviral medication you will take for 5 days.   You may also take acetaminophen (Tylenol) as needed for fever.  HOME CARE: . Only take medications as instructed by your medical team. . Drink plenty of fluids and get plenty of rest. . A steam or ultrasonic humidifier can help if you have congestion.   GET HELP RIGHT AWAY IF YOU HAVE EMERGENCY WARNING SIGNS.  Call 911 or proceed to your closest emergency facility if: . You develop worsening high fever. . Trouble breathing . Bluish lips or face . Persistent pain or pressure in the chest . New confusion . Inability to wake or stay awake . You cough up blood. . Your symptoms become more severe . Inability to hold down food or fluids  This list is not all possible symptoms. Contact your medical provider for any symptoms that are severe or concerning to you.    Your e-visit answers were reviewed by a board certified advanced clinical practitioner to complete your personal care plan.  Depending on the condition, your plan could have included both over the counter or prescription medications.  If there is a problem please reply once you have received a response from your provider.  Your safety is important to [place].  If you have drug allergies check your prescription carefully.    You can use MyChart to ask questions about  today's visit, request a non-urgent call back, or ask for a work or school excuse for 24 hours related to this e-Visit. If it has been greater than 24 hours you will need to follow up with your provider, or enter a new e-Visit to address those concerns. You will get an e-mail in the next two days asking about your experience.  I hope that your e-visit has been valuable and will speed your recovery. Thank you for using e-visits.    Approximately 5 minutes was spent documenting and reviewing patient's chart.

## 2020-06-27 NOTE — Addendum Note (Signed)
Addended by: Waldon Merl on: 06/27/2020 01:58 PM   Modules accepted: Orders

## 2020-06-28 ENCOUNTER — Telehealth: Payer: Self-pay

## 2020-06-28 NOTE — Telephone Encounter (Signed)
Called to discuss with patient about COVID-19 symptoms and the use of one of the available treatments for those with mild to moderate Covid symptoms and at a high risk of hospitalization.  Pt appears to qualify for outpatient treatment due to co-morbid conditions and/or a member of an at-risk group in accordance with the FDA Emergency Use Authorization.    Symptom onset: 06/23/20 Cough,headache,sore throat Vaccinated: Yes Booster? Yes Immunocompromised? No Qualifiers:  NIH Criteria: [employer] 2  Pt. States he is better, declines any further treatment.   [name]

## 2020-07-06 ENCOUNTER — Encounter (INDEPENDENT_AMBULATORY_CARE_PROVIDER_SITE_OTHER): Payer: Self-pay

## 2021-10-06 ENCOUNTER — Other Ambulatory Visit: Payer: Self-pay

## 2021-10-06 ENCOUNTER — Emergency Department (HOSPITAL_BASED_OUTPATIENT_CLINIC_OR_DEPARTMENT_OTHER): Payer: No Typology Code available for payment source | Admitting: Radiology

## 2021-10-06 ENCOUNTER — Encounter (HOSPITAL_BASED_OUTPATIENT_CLINIC_OR_DEPARTMENT_OTHER): Payer: Self-pay | Admitting: Emergency Medicine

## 2021-10-06 ENCOUNTER — Emergency Department (HOSPITAL_BASED_OUTPATIENT_CLINIC_OR_DEPARTMENT_OTHER)
Admission: EM | Admit: 2021-10-06 | Discharge: 2021-10-06 | Disposition: A | Discharge status: Home / Self Care | Payer: No Typology Code available for payment source | Attending: Emergency Medicine | Admitting: Emergency Medicine

## 2021-10-06 DIAGNOSIS — S99911A Unspecified injury of right ankle, initial encounter: Secondary | ICD-10-CM | POA: Diagnosis present

## 2021-10-06 DIAGNOSIS — Y9366 Activity, soccer: Secondary | ICD-10-CM | POA: Insufficient documentation

## 2021-10-06 DIAGNOSIS — X500XXA Overexertion from strenuous movement or load, initial encounter: Secondary | ICD-10-CM | POA: Insufficient documentation

## 2021-10-06 DIAGNOSIS — S93401A Sprain of unspecified ligament of right ankle, initial encounter: Secondary | ICD-10-CM | POA: Diagnosis not present

## 2021-10-06 NOTE — ED Provider Notes (Signed)
[facility] EMERGENCY DEPT Provider Note   CSN: [id number] Arrival date & time: 10/06/21  1043     History  Chief Complaint  Patient presents with   Ankle Pain    Jacob Mcmahon is a 41 y.o. male.  With no significant past medical history who presents with right ankle pain and swelling after rolling it and hearing a popping sound while playing soccer yesterday.  Patient was playing soccer yesterday when he rolled his ankle and everted it and heard a pop sound.  He experienced pain and swelling to his right ankle.  He has been able to bear weight and has applied his own ankle brace at home.  He has taken ibuprofen with improvement of pain.  He notes history of previous broken ankle.  He denies any other associated injuries.   Ankle Pain      Home Medications Prior to Admission medications   Medication Sig Start Date End Date Taking? Authorizing Provider  albuterol (VENTOLIN HFA) 108 (90 Base) MCG/ACT inhaler Inhale 2 puffs into the lungs every 6 (six) hours as needed for wheezing or shortness of breath. 06/27/20   Junie Spencer, FNP  benzonatate (TESSALON PERLES) 100 MG capsule Take 1 capsule (100 mg total) by mouth 3 (three) times daily as needed. 06/27/20   Jannifer Rodney A, FNP  dexamethasone (DECADRON) 6 MG tablet Take 1 tablet (6 mg total) by mouth 2 (two) times daily. 06/27/20   Junie Spencer, FNP  HYDROcodone-acetaminophen (NORCO/VICODIN) 5-325 MG tablet Take 2 tablets by mouth every 6 (six) hours as needed. 02/06/18   Roxy Horseman, PA-C  Multiple Vitamin (MULTIVITAMIN) tablet Take 1 tablet by mouth daily.    [provider]  omeprazole (PRILOSEC OTC) 20 MG tablet Take 20 mg by mouth daily.    [provider]  ondansetron (ZOFRAN ODT) 4 MG disintegrating tablet Take 1 tablet (4 mg total) by mouth every 8 (eight) hours as needed for nausea or vomiting. 02/06/18   Roxy Horseman, PA-C  sucralfate (CARAFATE) 1 g tablet Take 1 tablet (1  g total) by mouth 4 (four) times daily -  with meals and at bedtime. 02/06/18   Roxy Horseman, PA-C      Allergies    Patient has no known allergies.    Review of Systems   Review of Systems  Physical Exam Updated Vital Signs BP 119/78 (BP Location: Left Arm)   Pulse 77   Temp 98.3 F (36.8 C)   Resp 14   SpO2 97%  Physical Exam Constitutional: Alert and oriented. Well appearing and in no distress. Eyes: Conjunctivae are normal. ENT      Head: Normocephalic and atraumatic.      Nose: No congestion.      Mouth/Throat: Mucous membranes are moist.      Neck: No stridor. Cardiovascular: S1, S2,  Normal and symmetric distal pulses are present in all extremities.Warm and well perfused. Respiratory: Normal respiratory effort. Breath sounds are normal. Gastrointestinal: Soft and nontender. There is no CVA tenderness. Musculoskeletal: Normal range of motion in all extremities.      Right lower leg: Patient with swelling overlying the anterior medial and lateral malleolus with most swelling overlying the right foot lateral malleolus with tenderness to palpation to right lateral malleolus.  No tenderness distal to malleolus or proximal of the tibia/fibula.  Palpable DP pulses.  Sensation intact distally and full range of motion of foot and toes intact.      Left lower leg: No  tenderness or edema. Neurologic: Normal speech and language. No gross focal neurologic deficits are appreciated. Skin: Skin is warm, dry and intact. No rash noted. Psychiatric: Mood and affect are normal. Speech and behavior are normal.  ED Results / Procedures / Treatments   Labs (all labs ordered are listed, but only abnormal results are displayed) Labs Reviewed - No data to display  EKG None  Radiology DG Ankle Complete Right  Result Date: 10/06/2021 CLINICAL DATA:  C/o Rt ankle pain/swelling after he twisted it yesterday playing soccer. Hx 2 prior fractures to Rt ankle EXAM: RIGHT ANKLE - COMPLETE 3+  VIEW COMPARISON:  None Available. FINDINGS: Well corticated bone fragment lies along the inferior margin of the medial malleolus consistent with an old fracture. No acute fracture.  Ankle joint is normally spaced and aligned. Soft tissue swelling, predominating laterally. IMPRESSION: 1. No acute fracture or dislocation. Electronically Signed   By: Amie Portland M.D.   On: 10/06/2021 11:18    Procedures Procedures    Medications Ordered in ED Medications - No data to display  ED Course/ Medical Decision Making/ A&P Clinical Course as of 10/06/21 1527  Sun Oct 06, 2021  1127 Right ankle interpreted as no acute fracture.  I agree with this read, there is evidence of old fracture of the medial malleolus however pain is overlying the lateral malleolus where there is no acute injury.  We will give patient ankle boot and follow-up with orthopedics. [VB]    Clinical Course User Index [VB] Mardene Sayer, MD                           Medical Decision Making Jacob Mcmahon is a 41 y.o. male.  With no significant past medical history who presents with right ankle pain and swelling after rolling it and hearing a popping sound while playing soccer yesterday.  Patient with swelling overlying the anterior medial and lateral malleolus with most swelling overlying the right foot lateral malleolus with tenderness to palpation to right lateral malleolus.  No tenderness distal to malleolus or proximal of the tibia/fibula.  Palpable DP pulses.  Sensation intact distally and full range of motion of foot and toes intact.  Right lower extremity neurovascularly intact.  Patient's presentation was most consistent with ankle sprain versus acute fracture.  Plain film of right ankle with no evidence of acute fracture or dislocation. Treated as ankle sprain and provided with CAM boot and referral to ortho. Advised RICE method and supportive care.  Amount and/or Complexity of Data Reviewed Radiology: ordered and  independent interpretation performed. Decision-making details documented in ED Course.    Final Clinical Impression(s) / ED Diagnoses Final diagnoses:  Sprain of right ankle, unspecified ligament, initial encounter    Rx / DC Orders ED Discharge Orders     None         Mardene Sayer, MD 10/06/21 1527

## 2021-10-06 NOTE — Discharge Instructions (Signed)
You were seen in the Emergency Department for right ankle pain. Your x-ray did not show any signs of an acute fracture or dislocation. Your pain and swelling is likely from a sprain. Take ibuprofen, as directed on the packaging, to reduce pain and inflammation at home.   To take care of your ankle while you heal,  R- rest injury as needed, do not overexert yourself I - ice the affected area, 20 minutes every 4-6 hours, this will help with any swelling C- compress the affected area as needed to apply support and to decrease any swelling E- elevate frequently when in sitting or laying position.   Use crutches and bear weight as tolerated.  Call the number listed in your discharge paper to make an appointment with orthopedics.  Return to the Emergency Department if you develop worsening pain, swelling, redness, warmth, numbness, loss of sensation, fever, chills, vomiting, or any other symptoms you find concerning.

## 2021-10-06 NOTE — ED Notes (Signed)
Discharge paperwork given and verbally understood. 

## 2021-10-06 NOTE — ED Triage Notes (Addendum)
Pt rolled right ankle while playing soccer yesterday morning.  Pt states he "possibly head a popping sound."  Bruising and swelling noted to right ankle, ankle brace applied by pt at home. Previous break x2 to right ankle, most recently 2007.  Pt took 400mg  ibuprofen at approx 9am today.

## 2023-05-25 ENCOUNTER — Encounter (HOSPITAL_BASED_OUTPATIENT_CLINIC_OR_DEPARTMENT_OTHER): Payer: Self-pay | Admitting: *Deleted

## 2023-05-25 ENCOUNTER — Emergency Department (HOSPITAL_BASED_OUTPATIENT_CLINIC_OR_DEPARTMENT_OTHER)
Admission: EM | Admit: 2023-05-25 | Discharge: 2023-05-25 | Disposition: A | Discharge status: Home / Self Care | Attending: Emergency Medicine | Admitting: Emergency Medicine

## 2023-05-25 ENCOUNTER — Other Ambulatory Visit: Payer: Self-pay

## 2023-05-25 DIAGNOSIS — M545 Low back pain, unspecified: Secondary | ICD-10-CM | POA: Insufficient documentation

## 2023-05-25 MED ORDER — HYDROCODONE-ACETAMINOPHEN 5-325 MG PO TABS: 1.0000 | ORAL_TABLET | Freq: Four times a day (QID) | ORAL | 0 refills | Status: DC | PRN

## 2023-05-25 MED ORDER — NAPROXEN 500 MG PO TABS: 500.0000 mg | ORAL_TABLET | Freq: Two times a day (BID) | ORAL | 0 refills | Status: DC

## 2023-05-25 MED ORDER — HYDROCODONE-ACETAMINOPHEN 5-325 MG PO TABS: 1.0000 | ORAL_TABLET | Freq: Four times a day (QID) | ORAL | 0 refills | Status: AC | PRN

## 2023-05-25 MED ORDER — NAPROXEN 500 MG PO TABS: 500.0000 mg | ORAL_TABLET | Freq: Two times a day (BID) | ORAL | 0 refills | Status: AC

## 2023-05-25 MED ORDER — OXYCODONE-ACETAMINOPHEN 5-325 MG PO TABS
2.0000 | ORAL_TABLET | Freq: Once | ORAL | Status: AC
  Administered 2023-05-25: 2 via ORAL
  Filled 2023-05-25: qty 2

## 2023-05-25 MED ORDER — KETOROLAC TROMETHAMINE 60 MG/2ML IM SOLN
60.0000 mg | Freq: Once | INTRAMUSCULAR | Status: AC
  Administered 2023-05-25: 60 mg via INTRAMUSCULAR
  Filled 2023-05-25: qty 2

## 2023-05-25 NOTE — Discharge Instructions (Signed)
Begin taking naproxen as prescribed.  Begin taking hydrocodone as prescribed as needed for pain not relieved with naproxen.  Follow-up with primary doctor if symptoms are not improving in the next week.

## 2023-05-25 NOTE — ED Triage Notes (Addendum)
 Pt states he was playing basketball when he started having lower back pain. Rates pain 8 with movement and less when laying still. Pt took ibuprofen around 2100. Denies any radiation of pain. No other complaints. Pt drove himself here.

## 2023-05-25 NOTE — ED Provider Notes (Signed)
  [place] EMERGENCY DEPARTMENT AT [facility] Provider Note   CSN: [id number] Arrival date & time: 05/25/23  0113     History  Chief Complaint  Patient presents with   Back Pain    Jacob Mcmahon is a 43 y.o. male.  Patient is a 43 year old male with no significant past medical history.  Patient presenting today with complaints of low back pain.  He reports playing basketball earlier today and felt a pull in his back.  He has been having discomfort in his low back since.  Pain is worse with movement and change in position.  He denies any bowel or bladder complaints.  No weakness or numbness.  No radiation into his legs.  He has tried ibuprofen at home with minimal relief.       Home Medications Prior to Admission medications   Not on File      Allergies    Patient has no known allergies.    Review of Systems   Review of Systems  All other systems reviewed and are negative.   Physical Exam Updated Vital Signs BP 131/66 (BP Location: Right Arm)   Pulse 65   Temp 98 F (36.7 C)   Resp (!) 22   Ht 6\' 4"  (1.93 m)   Wt 117.9 kg   SpO2 99%   BMI 31.65 kg/m  Physical Exam Vitals and nursing note reviewed.  Constitutional:      Appearance: Normal appearance.  Pulmonary:     Effort: Pulmonary effort is normal.  Musculoskeletal:     Comments: There is tenderness to palpation of the soft tissues of the lower lumbar region.  Skin:    General: Skin is warm and dry.  Neurological:     Mental Status: He is alert and oriented to person, place, and time.     Comments: DTRs are 2+ and symmetrical in the patellar and Achilles tendons bilaterally.  Strength is 5 out of 5 in both lower extremities.  He is able to walk on heels and toes.     ED Results / Procedures / Treatments   Labs (all labs ordered are listed, but only abnormal results are displayed) Labs Reviewed - No data to display  EKG None  Radiology No results found.  Procedures Procedures     Medications Ordered in ED Medications  oxyCODONE-acetaminophen (PERCOCET/ROXICET) 5-325 MG per tablet 2 tablet (has no administration in time range)  ketorolac (TORADOL) injection 60 mg (has no administration in time range)    ED Course/ Medical Decision Making/ A&P  Patient presenting today with complaints of low back pain as described in the HPI.  Patient arrives with stable vital signs and is afebrile.  Physical examination reveals lower lumbar tenderness, but is otherwise unremarkable.  His strength and reflexes are symmetrical and has no bowel or bladder complaints.  Patient given IM Toradol along with Percocet for pain and seems to be feeling better.  Patient to be discharged with naproxen and pain medication.  To follow-up as needed if not improving.  Final Clinical Impression(s) / ED Diagnoses Final diagnoses:  None    Rx / DC Orders ED Discharge Orders     None         Geoffery Lyons, MD 05/25/23 [phone]
